# Patient Record
Sex: Female | Born: 1990 | Race: White | Hispanic: No | Marital: Single | Smoking: Former smoker
Health system: Southern US, Community
[De-identification: ages and names within clinical notes are randomized; demographics above are authoritative.]

## PROBLEM LIST (undated history)

## (undated) HISTORY — PX: MASTECTOMY: SHX3

---

## 2018-12-30 ENCOUNTER — Emergency Department
Admission: EM | Admit: 2018-12-30 | Discharge: 2018-12-30 | Disposition: A | Discharge status: Home / Self Care | Payer: Self-pay | Attending: Emergency Medicine | Admitting: Emergency Medicine

## 2018-12-30 ENCOUNTER — Other Ambulatory Visit: Payer: Self-pay

## 2018-12-30 ENCOUNTER — Emergency Department: Payer: Self-pay

## 2018-12-30 ENCOUNTER — Encounter: Payer: Self-pay | Admitting: Emergency Medicine

## 2018-12-30 DIAGNOSIS — Y9389 Activity, other specified: Secondary | ICD-10-CM | POA: Insufficient documentation

## 2018-12-30 DIAGNOSIS — Z87891 Personal history of nicotine dependence: Secondary | ICD-10-CM | POA: Insufficient documentation

## 2018-12-30 DIAGNOSIS — Y998 Other external cause status: Secondary | ICD-10-CM | POA: Insufficient documentation

## 2018-12-30 DIAGNOSIS — Z4789 Encounter for other orthopedic aftercare: Secondary | ICD-10-CM

## 2018-12-30 DIAGNOSIS — Y92018 Other place in single-family (private) house as the place of occurrence of the external cause: Secondary | ICD-10-CM | POA: Insufficient documentation

## 2018-12-30 DIAGNOSIS — W132XXA Fall from, out of or through roof, initial encounter: Secondary | ICD-10-CM | POA: Insufficient documentation

## 2018-12-30 DIAGNOSIS — S93314A Dislocation of tarsal joint of right foot, initial encounter: Secondary | ICD-10-CM | POA: Insufficient documentation

## 2018-12-30 LAB — CBC WITH DIFFERENTIAL/PLATELET
Abs Immature Granulocytes: 0.03 K/uL (ref 0.00–0.07)
Basophils Absolute: 0 K/uL (ref 0.0–0.1)
Basophils Relative: 0 %
Eosinophils Absolute: 0 K/uL (ref 0.0–0.5)
Eosinophils Relative: 0 %
HCT: 41.9 % (ref 36.0–46.0)
Hemoglobin: 14.2 g/dL (ref 12.0–15.0)
Immature Granulocytes: 0 %
Lymphocytes Relative: 28 %
Lymphs Abs: 2.7 K/uL (ref 0.7–4.0)
MCH: 27 pg (ref 26.0–34.0)
MCHC: 33.9 g/dL (ref 30.0–36.0)
MCV: 79.7 fL — ABNORMAL LOW (ref 80.0–100.0)
Monocytes Absolute: 0.6 K/uL (ref 0.1–1.0)
Monocytes Relative: 6 %
Neutro Abs: 6.4 K/uL (ref 1.7–7.7)
Neutrophils Relative %: 66 %
Platelets: 330 K/uL (ref 150–400)
RBC: 5.26 MIL/uL — ABNORMAL HIGH (ref 3.87–5.11)
RDW: 14 % (ref 11.5–15.5)
WBC: 9.8 K/uL (ref 4.0–10.5)
nRBC: 0 % (ref 0.0–0.2)

## 2018-12-30 LAB — BASIC METABOLIC PANEL
Anion gap: 13 (ref 5–15)
BUN: 8 mg/dL (ref 6–20)
CO2: 19 mmol/L — ABNORMAL LOW (ref 22–32)
Calcium: 9.4 mg/dL (ref 8.9–10.3)
Chloride: 105 mmol/L (ref 98–111)
Creatinine, Ser: 0.68 mg/dL (ref 0.44–1.00)
GFR calc Af Amer: >60 mL/min (ref >60)
GFR calc non Af Amer: >60 mL/min (ref >60)
Glucose, Bld: 90 mg/dL (ref 70–99)
Potassium: 4.1 mmol/L (ref 3.5–5.1)
Sodium: 137 mmol/L (ref 135–145)

## 2018-12-30 MED ORDER — PROPOFOL BOLUS VIA INFUSION: 1.0000 mg/kg | INTRAVENOUS | Status: DC

## 2018-12-30 MED ORDER — PROPOFOL 10 MG/ML IV BOLUS
1.0000 mg/kg | Freq: Once | INTRAVENOUS | Status: AC
  Administered 2018-12-30: 16:00:00 89.8 mg via INTRAVENOUS

## 2018-12-30 MED ORDER — HYDROMORPHONE HCL 1 MG/ML IJ SOLN
INTRAMUSCULAR | Status: AC
  Filled 2018-12-30: qty 1

## 2018-12-30 MED ORDER — PROPOFOL 1000 MG/100ML IV EMUL
INTRAVENOUS | Status: AC
  Filled 2018-12-30: qty 100

## 2018-12-30 MED ORDER — ONDANSETRON HCL 4 MG/2ML IJ SOLN
4.0000 mg | Freq: Once | INTRAMUSCULAR | Status: AC
  Administered 2018-12-30: 4 mg via INTRAVENOUS
  Filled 2018-12-30: qty 2

## 2018-12-30 MED ORDER — HYDROMORPHONE HCL 1 MG/ML IJ SOLN
1.0000 mg | Freq: Once | INTRAMUSCULAR | Status: AC
  Administered 2018-12-30: 15:00:00 1 mg via INTRAVENOUS
  Filled 2018-12-30: qty 1

## 2018-12-30 MED ORDER — PROPOFOL 10 MG/ML IV BOLUS: 1.0000 mg/kg | Freq: Once | INTRAVENOUS | Status: DC

## 2018-12-30 MED ORDER — SODIUM CHLORIDE 0.9 % IV BOLUS
1000.0000 mL | Freq: Once | INTRAVENOUS | Status: AC
  Administered 2018-12-30: 15:00:00 1000 mL via INTRAVENOUS

## 2018-12-30 MED ORDER — OXYCODONE-ACETAMINOPHEN 5-325 MG PO TABS: 1.0000 | ORAL_TABLET | ORAL | 0 refills | Status: AC | PRN

## 2018-12-30 MED ORDER — HYDROMORPHONE HCL 1 MG/ML IJ SOLN
0.5000 mg | Freq: Once | INTRAMUSCULAR | Status: AC
  Administered 2018-12-30: 0.5 mg via INTRAVENOUS

## 2018-12-30 NOTE — ED Notes (Addendum)
Additional 1/2 dose propofol given at this time by dr Charna Archer

## 2018-12-30 NOTE — ED Triage Notes (Addendum)
Pt to ED after 12 foot fall off roof.  Abrasions to hands. No LOC. Right foot internally fixed at this time with mottled appearance. Cool to touch. Cap refill < 3 seconds at this time. philly applied.

## 2018-12-30 NOTE — Sedation Documentation (Addendum)
Additional 1/2 dose propofol given at this time

## 2018-12-30 NOTE — ED Notes (Signed)
This RN attempted IV twice without success

## 2018-12-30 NOTE — ED Notes (Addendum)
Additional 100 mg propofol administered by dr Quentin Cornwall at this time

## 2018-12-30 NOTE — ED Provider Notes (Signed)
[facility] Emergency Department Provider Note  ____________________________________________  Time seen: Approximately 2:28 PM  I have reviewed the triage vital signs and the nursing notes.   HISTORY  Chief Complaint Fall    HPI Carol Shelton is a 28 y.o. female with no significant past medical history who comes the ED complaining of right foot, ankle, knee pain after a fall.  She was attaching Christmas lights to her roof when she fell off, approximately 12 foot fall to the ground.  She landed in a bush which broke her fall, but her right foot was able to strike the ground very hard before the rest of her was stopped.  No head injury or loss of consciousness.  Denies neck pain or back pain.  No chest pain or shortness of breathing or other complaints.  Tetanus up-to-date.      History reviewed. No pertinent past medical history.   There are no active problems to display for this patient.    Past Surgical History:  Procedure Laterality Date  . MASTECTOMY       Prior to Admission medications   Not on File  None   Allergies Hydrocodone   History reviewed. No pertinent family history.  Social History Social History   Tobacco Use  . Smoking status: Former Games developer  . Smokeless tobacco: Never Used  Substance Use Topics  . Alcohol use: Never    Frequency: Never  . Drug use: Never    Review of Systems  Constitutional:   No fever or chills.  ENT:   No sore throat. No rhinorrhea. Cardiovascular:   No chest pain or syncope. Respiratory:   No dyspnea or cough. Gastrointestinal:   Negative for abdominal pain, vomiting and diarrhea.  Musculoskeletal:   Right lower extremity pain as above.  Left third finger pain All other systems reviewed and are negative except as documented above in ROS and HPI.  ____________________________________________   PHYSICAL EXAM:  VITAL SIGNS: ED Triage Vitals  Enc Vitals Group     BP 12/30/18 1353  (!) 148/137     Pulse Rate 12/30/18 1353 94     Resp 12/30/18 1353 18     Temp 12/30/18 1353 98.6 F (37 C)     Temp Source 12/30/18 1350 Oral     SpO2 12/30/18 1353 97 %     Weight 12/30/18 1401 198 lb (89.8 kg)     Height 12/30/18 1401 5\' 7"  (1.702 m)     Head Circumference --      Peak Flow --      Pain Score 12/30/18 1401 8     Pain Loc --      Pain Edu? --      Excl. in GC? --     Vital signs reviewed, nursing assessments reviewed.   Constitutional:   Alert and oriented. Non-toxic appearance. Eyes:   Conjunctivae are normal. EOMI. PERRL. ENT      Head:   Normocephalic and atraumatic.      Nose:   Wearing a mask.      Mouth/Throat:   Wearing a mask.      Neck:   No meningismus. Full ROM.  No midline spinal tenderness. Hematological/Lymphatic/Immunilogical:   No cervical lymphadenopathy. Cardiovascular:   RRR. Symmetric bilateral radial and DP pulses.  No murmurs. Cap refill is 3 seconds in all extremities. Respiratory:   Normal respiratory effort without tachypnea/retractions. Breath sounds are clear and equal bilaterally. No wheezes/rales/rhonchi. Gastrointestinal:   Soft and  nontender. Non distended. There is no CVA tenderness.  No rebound, rigidity, or guarding.  Musculoskeletal: No midline spinal tenderness.  Swelling over the proximal phalanx of the left third finger without bony tenderness.  Intact passive and active range of motion in all flexors and extensors.  Tenderness over the lateral aspect of the right knee without crepitus or instability.  Deformity of right ankle with fixed inversion.  Tenderness diffusely over the ankle and midfoot.  No long bone shaft tenderness in the leg.  Right hip nontender with intact range of motion.  Other extremities unremarkable..  Neurologic:   Normal speech and language.  Motor grossly intact. No acute focal neurologic deficits are appreciated.  Skin:    Skin is warm, dry and intact. No rash noted.  No petechiae, purpura,  or bullae.  ____________________________________________    LABS (pertinent positives/negatives) (all labs ordered are listed, but only abnormal results are displayed) Labs Reviewed  BASIC METABOLIC PANEL - Abnormal; Notable for the following components:      Result Value   CO2 19 (*)    All other components within normal limits  CBC WITH DIFFERENTIAL/PLATELET - Abnormal; Notable for the following components:   RBC 5.26 (*)    MCV 79.7 (*)    All other components within normal limits   ____________________________________________   EKG    ____________________________________________    RADIOLOGY  Dg Ankle Complete Right  Result Date: 12/30/2018 CLINICAL DATA:  Fall 12 feet from roof today with right ankle pain/deformity. EXAM: RIGHT ANKLE - COMPLETE 3+ VIEW COMPARISON:  None. FINDINGS: Slight widening of the ankle mortise laterally. Abnormal orientation of the talus and calcaneus as there appears to be lateral rotatory subluxation of the calcaneus with respect to the talus. No acute fracture visualized. IMPRESSION: No acute fracture visualized. 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 Slight widening of the lateral aspect of the ankle mortise. Electronically Signed   By: Marin Olp M.D.   On: 12/30/2018 15:04   Dg Knee Complete 4 Views Right  Result Date: 12/30/2018 CLINICAL DATA:  Fall 12 feet from roof today with right knee pain. EXAM: RIGHT KNEE - COMPLETE 4+ VIEW COMPARISON:  None. FINDINGS: No evidence of fracture, dislocation, or joint effusion. No evidence of arthropathy or other focal bone abnormality. Soft tissues are unremarkable. IMPRESSION: Negative. Electronically Signed   By: Marin Olp M.D.   On: 12/30/2018 15:01   Dg Foot Complete Right  Result Date: 12/30/2018 CLINICAL DATA:  Fall 12 feet from roof with right foot pain. EXAM: RIGHT FOOT COMPLETE - 3+ VIEW COMPARISON:   None. FINDINGS: There are findings lateral rotatory subluxation of the calcaneus with respect to the talus. No definite fracture visualized. Remainder the exam is unremarkable. IMPRESSION: No acute fracture visualized. 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 Electronically Signed   By: Marin Olp M.D.   On: 12/30/2018 15:00    ____________________________________________   PROCEDURES Procedures  ____________________________________________  DIFFERENTIAL DIAGNOSIS   Right ankle fracture, right ankle dislocation, foot fracture, knee fracture  CLINICAL IMPRESSION / ASSESSMENT AND PLAN / ED COURSE  Medications ordered in the ED: Medications  propofol (DIPRIVAN) 10 mg/mL bolus/IV push 89.8 mg (has no administration in time range)  sodium chloride 0.9 % bolus 1,000 mL (1,000 mLs Intravenous New Bag/Given 12/30/18 1500)  HYDROmorphone (DILAUDID) injection 1 mg (1 mg Intravenous  Given 12/30/18 1458)  ondansetron (ZOFRAN) injection 4 mg (4 mg Intravenous Given 12/30/18 1458)    Pertinent labs & imaging results that were available during my care of the patient were reviewed by me and considered in my medical decision making (see chart for details).  Darryl NestleDanielle E Tolbert was evaluated in Emergency Department on 12/30/2018 for the symptoms described in the history of present illness. She was evaluated in the context of the global COVID-19 pandemic, which necessitated consideration that the patient might be at risk for infection with the SARS-CoV-2 virus that causes COVID-19. Institutional protocols and algorithms that pertain to the evaluation of patients at risk for COVID-19 are in a state of rapid change based on information released by regulatory bodies including the CDC and federal and state organizations. These policies and algorithms were followed during the patient's care in the ED.     Clinical Course as  of Dec 29 1536  Wed Dec 30, 2018  1406 Patient presents with ankle deformity after a 12 foot fall off of a roof landing in a bush.  She has tenderness of the right knee ankle and foot with inversion deformity of the ankle suspicious for dislocation and possibly fracture of the ankle.  There is a palpable DP pulse and cap refill is symmetric to other extremities.   [PS]  1407 We will give 1 mg IV Dilaudid for pain control, 1 L saline bolus for hydration, obtain x-rays and plan for deep sedation for reduction and splinting.   [PS]    Clinical Course User Index [PS] Sharman CheekStafford, Aurelio Mccamy, MD     ----------------------------------------- 3:38 PM on 12/30/2018 -----------------------------------------  Awaiting nursing availability for sedation to do reduction.  Dr. Ernest PineHooten recommends postreduction films and agrees with traction reduction splinting and follow-up.  Care signed out to Dr. Larinda ButteryJessup for sedation and reduction at the end of my shift.  ____________________________________________   FINAL CLINICAL IMPRESSION(S) / ED DIAGNOSES    Final diagnoses:  Dislocation of right talus, initial encounter     ED Discharge Orders    None      Portions of this note were generated with dragon dictation software. Dictation errors may occur despite best attempts at proofreading.   Sharman CheekStafford, Woodfin Kiss, MD 12/30/18 1538

## 2018-12-30 NOTE — Sedation Documentation (Addendum)
Ankle now realigned

## 2018-12-30 NOTE — ED Provider Notes (Signed)
----------------------------------------- 12:06 AM on 12/31/2018 -----------------------------------------  Blood pressure 104/90, pulse 77, temperature 98 F (36.7 C), temperature source Oral, resp. rate 19, height 5\' 7"  (1.702 m), weight 89.8 kg, last menstrual period 12/16/2018, SpO2 97 %.  Assuming care from Dr. 12/18/2018.  In short, Carol Shelton is a 28 y.o. female with a chief complaint of Fall .  Refer to the original H&P for additional details.  The current plan of care is to perform reduction and splinting of subtalar dislocation at right ankle.  Patient was sedated with propofol but did not achieve adequate sedation following 1.5 mg/kg and reduction was unsuccessful.  She was given an additional 1 mg/kg of propofol and dislocation subsequently self reduced.  Patient very briefly required rescue breaths with BVM, subsequently began breathing on her own and oxygenating adequately.  She had no further complications with sedation and returned to her baseline mental status shortly afterwards.  Postreduction films showed appropriate positioning of right ankle and splint was placed.  Patient neurovascularly intact following reduction and splint application.  Dr. [id number] of orthopedics recommends follow-up with himself or Dr. Ernest Pine of podiatry and patient was provided with crutches as well as pain medication.  Patient agrees with plan.    .Sedation  Date/Time: 12/31/2018 12:03 AM Performed by: 14/10/2018, MD Authorized by: Chesley Noon, MD   Consent:    Consent obtained:  Verbal   Consent given by:  Patient   Risks discussed:  Allergic reaction, dysrhythmia, inadequate sedation, nausea, prolonged hypoxia resulting in organ damage, prolonged sedation necessitating reversal, respiratory compromise necessitating ventilatory assistance and intubation and vomiting   Alternatives discussed:  Analgesia without sedation, anxiolysis and regional anesthesia Universal protocol:     Procedure explained and questions answered to patient or proxy's satisfaction: yes     Relevant documents present and verified: yes     Test results available and properly labeled: yes     Imaging studies available: yes     Required blood products, implants, devices, and special equipment available: yes     Site/side marked: yes     Immediately prior to procedure a time out was called: yes     Patient identity confirmation method:  Verbally with patient Indications:    Procedure necessitating sedation performed by:  Physician performing sedation Pre-sedation assessment:    Time since last food or drink:  6   ASA classification: class 1 - normal, healthy patient     Neck mobility: normal     Mouth opening:  3 or more finger widths   Thyromental distance:  4 finger widths   Mallampati score:  I - soft palate, uvula, fauces, pillars visible   Pre-sedation assessments completed and reviewed: airway patency, cardiovascular function, hydration status, mental status, nausea/vomiting, pain level, respiratory function and temperature   Immediate pre-procedure details:    Reassessment: Patient reassessed immediately prior to procedure     Reviewed: vital signs, relevant labs/tests and NPO status     Verified: bag valve mask available, emergency equipment available, intubation equipment available, IV patency confirmed, oxygen available and suction available   Procedure details (see MAR for exact dosages):    Preoxygenation:  Nasal cannula   Sedation:  Propofol   Intended level of sedation: deep   Intra-procedure monitoring:  Blood pressure monitoring, cardiac monitor, continuous pulse oximetry, frequent LOC assessments, frequent vital sign checks and continuous capnometry   Intra-procedure events: none     Intra-procedure management:  BVM ventilation and airway repositioning   Total  Provider sedation time (minutes):  12 Post-procedure details:    Post-sedation assessment completed:  12/30/2018  5:00 PM   Attendance: Constant attendance by certified staff until patient recovered     Recovery: Patient returned to pre-procedure baseline     Post-sedation assessments completed and reviewed: airway patency, cardiovascular function, hydration status, mental status, nausea/vomiting, pain level, respiratory function and temperature     Patient is stable for discharge or admission: yes     Patient tolerance:  Tolerated well, no immediate complications .Ortho Injury Treatment  Date/Time: 12/31/2018 12:05 AM Performed by: Blake Divine, MD Authorized by: Blake Divine, MD   Consent:    Consent obtained:  Written   Consent given by:  Patient   Risks discussed:  Fracture, nerve damage, restricted joint movement, vascular damage, recurrent dislocation and irreducible dislocation   Alternatives discussed:  Alternative treatment, delayed treatment and immobilizationInjury location: ankle Location details: right ankle Injury type: dislocation Pre-procedure neurovascular assessment: neurovascularly intact Pre-procedure distal perfusion: normal Pre-procedure neurological function: normal Pre-procedure range of motion: reduced  Anesthesia: Local anesthesia used: no  Patient sedated: Yes. Refer to sedation procedure documentation for details of sedation. Manipulation performed: yes Reduction method: traction and counter traction Reduction successful: yes X-ray confirmed reduction: yes Immobilization: splint Splint type: short leg and ankle stirrup Supplies used: cotton padding,  Ortho-Glass and elastic bandage Post-procedure neurovascular assessment: post-procedure neurovascularly intact Post-procedure distal perfusion: normal Post-procedure neurological function: normal Post-procedure range of motion: normal Patient tolerance: patient tolerated the procedure well with no immediate complications       Blake Divine, MD 12/31/18 0010

## 2018-12-30 NOTE — ED Triage Notes (Signed)
Pt here with c/o falling off her roof, about 103ft an hour ago. States pain to right foot, landed in the bushes "feet first" feels like her right hip "got jammed." No distress noted at this time.

## 2018-12-30 NOTE — Sedation Documentation (Signed)
Pt o2 dropped and MD bagging pt at this time

## 2018-12-30 NOTE — Sedation Documentation (Signed)
Pt now alert enough to breath without any assistance

## 2018-12-30 NOTE — Sedation Documentation (Signed)
Pt now slightly more responsive but chin tilt still being used

## 2021-03-11 IMAGING — DX DG ANKLE COMPLETE 3+V*R*
4 series · 4 of 4 positions shown · non-contrast
Comparison: None.

CLINICAL DATA: Fall 12 feet from roof today with right ankle
pain/deformity.

EXAM:
RIGHT ANKLE - COMPLETE 3+ VIEW

[ankle ap (1 of 2)]
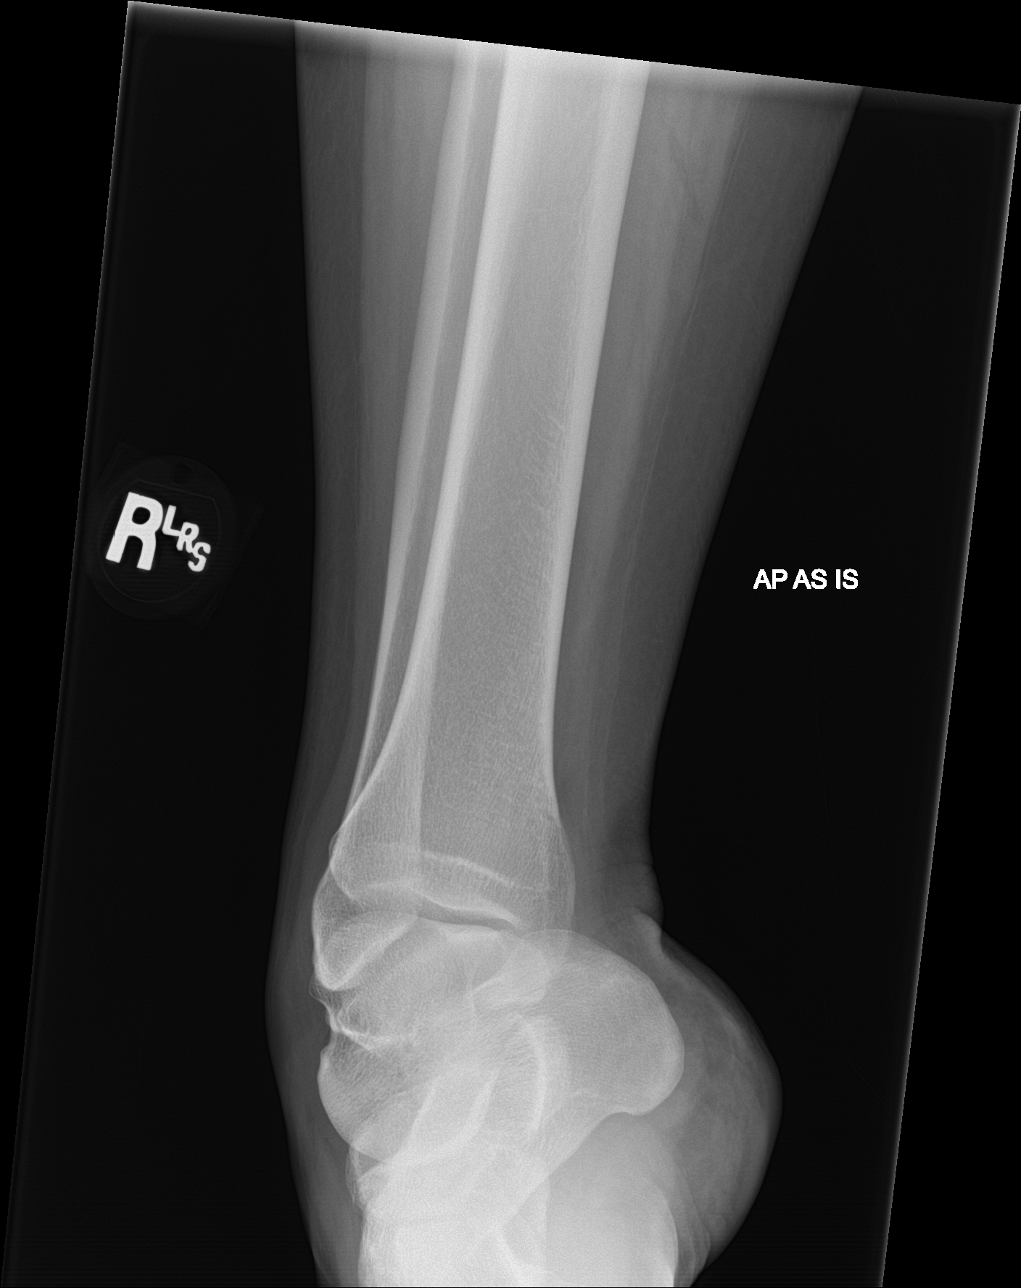

[ankle obl (1 of 2)]
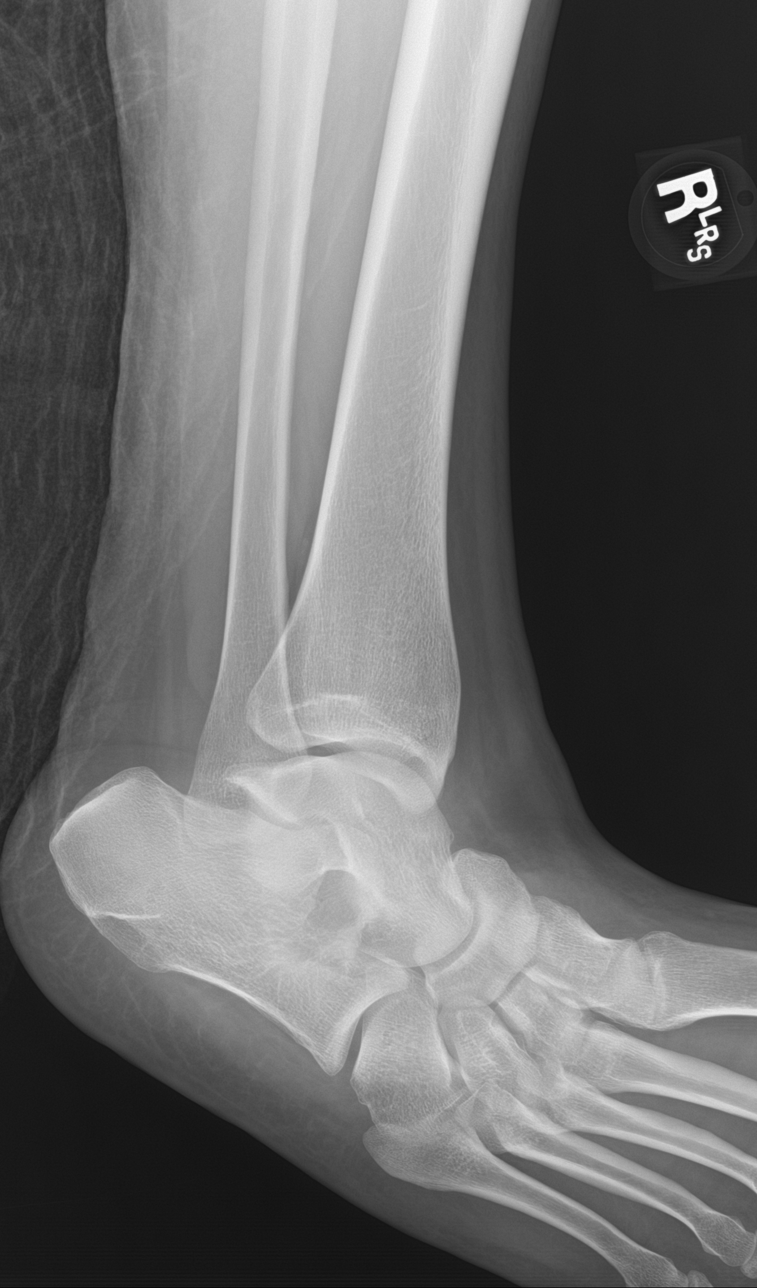

[ankle ap (2 of 2)]
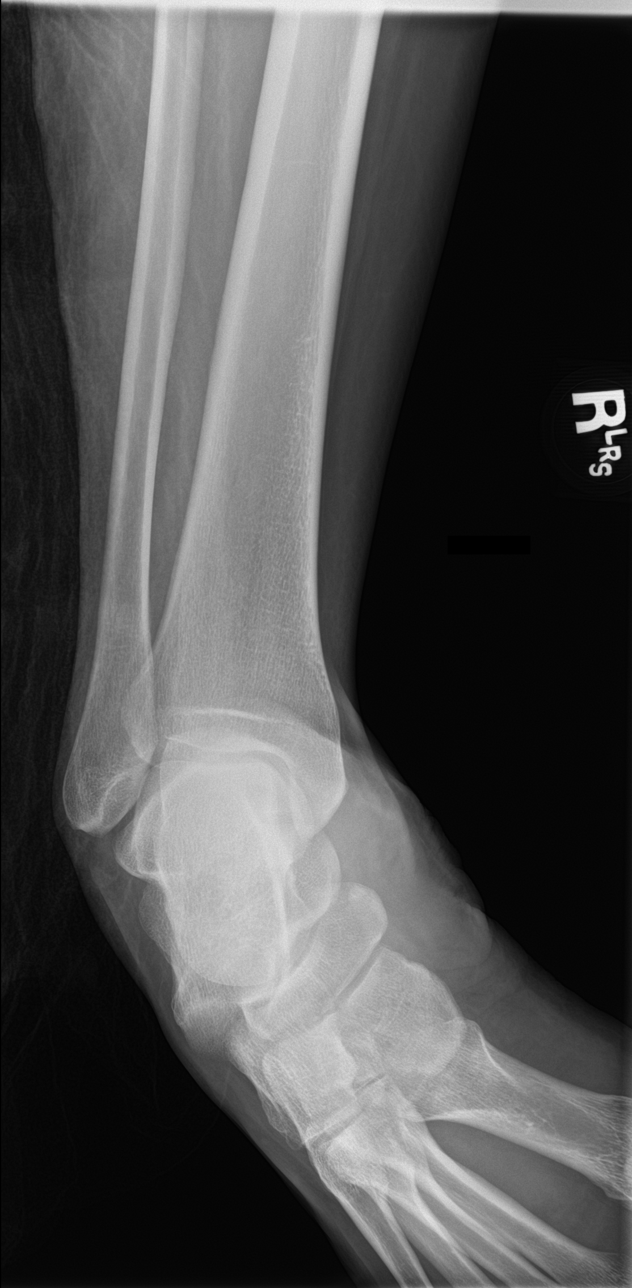

[ankle obl (2 of 2)]
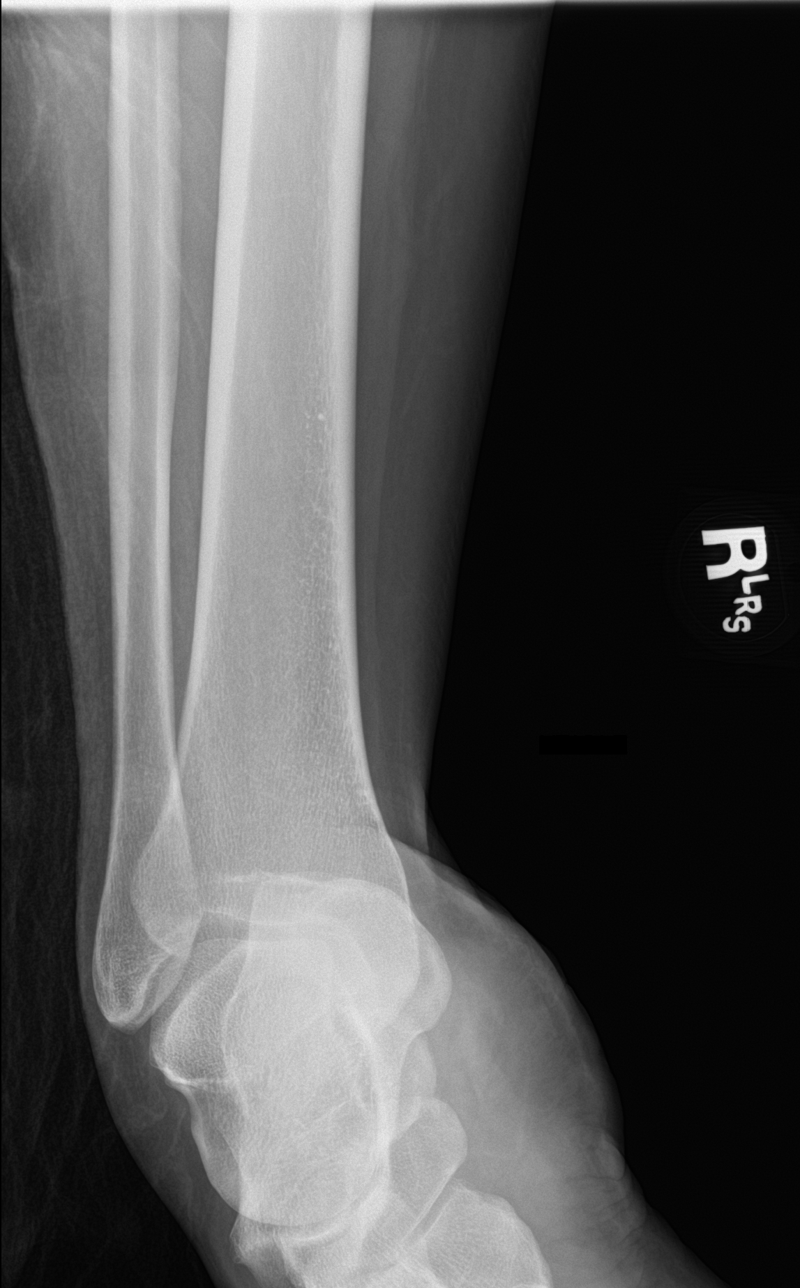

[4 of 4 positions shown; findings below may reference images not displayed]

FINDINGS: Slight widening of the ankle mortise laterally. Abnormal orientation
of the talus and calcaneus as there appears to be lateral rotatory
subluxation of the calcaneus with respect to the talus. No acute
fracture visualized.
IMPRESSION: No acute fracture visualized. Abnormal orientation of the talus and
calcaneus suggesting lateral rotary subluxation of the calcaneus
with respect to the talus. Slight widening of the lateral aspect of
the ankle mortise.
